# Patient Record
Sex: Female | Born: 1980 | Race: Black or African American | Hispanic: No | Marital: Single | Smoking: Never smoker
Health system: Southern US, Community
[De-identification: ages and names within clinical notes are randomized; demographics above are authoritative.]

## PROBLEM LIST (undated history)

## (undated) DIAGNOSIS — R0602 Shortness of breath: Secondary | ICD-10-CM

## (undated) DIAGNOSIS — IMO0001 Reserved for inherently not codable concepts without codable children: Secondary | ICD-10-CM

## (undated) DIAGNOSIS — I10 Essential (primary) hypertension: Secondary | ICD-10-CM

## (undated) DIAGNOSIS — J45909 Unspecified asthma, uncomplicated: Secondary | ICD-10-CM

## (undated) DIAGNOSIS — R600 Localized edema: Secondary | ICD-10-CM

## (undated) DIAGNOSIS — E7800 Pure hypercholesterolemia, unspecified: Secondary | ICD-10-CM

## (undated) HISTORY — DX: Pure hypercholesterolemia, unspecified: E78.00

## (undated) HISTORY — DX: Unspecified asthma, uncomplicated: J45.909

## (undated) HISTORY — DX: Localized edema: R60.0

## (undated) HISTORY — DX: Shortness of breath: R06.02

---

## 2008-03-09 ENCOUNTER — Emergency Department (HOSPITAL_BASED_OUTPATIENT_CLINIC_OR_DEPARTMENT_OTHER): Admission: EM | Admit: 2008-03-09 | Discharge: 2008-03-09 | Payer: Self-pay | Admitting: Emergency Medicine

## 2009-03-23 ENCOUNTER — Emergency Department (HOSPITAL_BASED_OUTPATIENT_CLINIC_OR_DEPARTMENT_OTHER): Admission: EM | Admit: 2009-03-23 | Discharge: 2009-03-23 | Payer: Self-pay | Admitting: Emergency Medicine

## 2010-04-04 LAB — STREP A DNA PROBE: Group A Strep Probe: NEGATIVE

## 2010-04-04 LAB — RAPID STREP SCREEN (MED CTR MEBANE ONLY): Streptococcus, Group A Screen (Direct): NEGATIVE

## 2010-07-18 ENCOUNTER — Emergency Department (HOSPITAL_BASED_OUTPATIENT_CLINIC_OR_DEPARTMENT_OTHER)
Admission: EM | Admit: 2010-07-18 | Discharge: 2010-07-18 | Disposition: A | Discharge status: Home / Self Care | Payer: Medicaid Other | Attending: Emergency Medicine | Admitting: Emergency Medicine

## 2010-07-18 ENCOUNTER — Emergency Department (INDEPENDENT_AMBULATORY_CARE_PROVIDER_SITE_OTHER): Payer: Medicaid Other

## 2010-07-18 ENCOUNTER — Encounter: Payer: Self-pay | Admitting: *Deleted

## 2010-07-18 DIAGNOSIS — W503XXA Accidental bite by another person, initial encounter: Secondary | ICD-10-CM

## 2010-07-18 DIAGNOSIS — S61459A Open bite of unspecified hand, initial encounter: Secondary | ICD-10-CM

## 2010-07-18 DIAGNOSIS — Y92009 Unspecified place in unspecified non-institutional (private) residence as the place of occurrence of the external cause: Secondary | ICD-10-CM | POA: Insufficient documentation

## 2010-07-18 DIAGNOSIS — S62609A Fracture of unspecified phalanx of unspecified finger, initial encounter for closed fracture: Secondary | ICD-10-CM | POA: Insufficient documentation

## 2010-07-18 DIAGNOSIS — IMO0002 Reserved for concepts with insufficient information to code with codable children: Secondary | ICD-10-CM

## 2010-07-18 DIAGNOSIS — S61409A Unspecified open wound of unspecified hand, initial encounter: Secondary | ICD-10-CM | POA: Insufficient documentation

## 2010-07-18 HISTORY — DX: Reserved for inherently not codable concepts without codable children: IMO0001

## 2010-07-18 MED ORDER — TETANUS-DIPHTH-ACELL PERTUSSIS 5-2.5-18.5 LF-MCG/0.5 IM SUSP
0.5000 mL | Freq: Once | INTRAMUSCULAR | Status: AC
  Administered 2010-07-18: 0.5 mL via INTRAMUSCULAR
  Filled 2010-07-18: qty 0.5

## 2010-07-18 MED ORDER — AMOXICILLIN-POT CLAVULANATE 875-125 MG PO TABS: 1.0000 | ORAL_TABLET | Freq: Two times a day (BID) | ORAL | Status: AC

## 2010-07-18 MED ORDER — SODIUM CHLORIDE 0.9 % IV SOLN
3.0000 g | Freq: Four times a day (QID) | INTRAVENOUS | Status: DC
  Administered 2010-07-18: 3 g via INTRAVENOUS

## 2010-07-18 MED ORDER — HYDROCODONE-ACETAMINOPHEN 5-325 MG PO TABS
2.0000 | ORAL_TABLET | Freq: Once | ORAL | Status: AC
  Administered 2010-07-18: 2 via ORAL
  Filled 2010-07-18: qty 2

## 2010-07-18 MED ORDER — SODIUM CHLORIDE 0.9 % IV SOLN
INTRAVENOUS | Status: AC
  Administered 2010-07-18: 3 g via INTRAVENOUS
  Filled 2010-07-18: qty 3

## 2010-07-18 MED ORDER — HYDROCODONE-ACETAMINOPHEN 5-325 MG PO TABS: 2.0000 | ORAL_TABLET | ORAL | Status: AC | PRN
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
## 2010-07-18 NOTE — ED Notes (Signed)
Soaking Pt. Fingers L middle and L ring in cleaning solution.
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
## 2012-01-31 ENCOUNTER — Encounter (HOSPITAL_BASED_OUTPATIENT_CLINIC_OR_DEPARTMENT_OTHER): Payer: Self-pay | Admitting: *Deleted

## 2012-01-31 ENCOUNTER — Emergency Department (HOSPITAL_BASED_OUTPATIENT_CLINIC_OR_DEPARTMENT_OTHER)
Admission: EM | Admit: 2012-01-31 | Discharge: 2012-02-01 | Disposition: A | Discharge status: Home / Self Care | Payer: No Typology Code available for payment source | Attending: Emergency Medicine | Admitting: Emergency Medicine

## 2012-01-31 DIAGNOSIS — Y9289 Other specified places as the place of occurrence of the external cause: Secondary | ICD-10-CM | POA: Insufficient documentation

## 2012-01-31 DIAGNOSIS — S43499A Other sprain of unspecified shoulder joint, initial encounter: Secondary | ICD-10-CM | POA: Insufficient documentation

## 2012-01-31 DIAGNOSIS — S8990XA Unspecified injury of unspecified lower leg, initial encounter: Secondary | ICD-10-CM | POA: Insufficient documentation

## 2012-01-31 DIAGNOSIS — M25569 Pain in unspecified knee: Secondary | ICD-10-CM

## 2012-01-31 DIAGNOSIS — Y9389 Activity, other specified: Secondary | ICD-10-CM | POA: Insufficient documentation

## 2012-01-31 DIAGNOSIS — Z79899 Other long term (current) drug therapy: Secondary | ICD-10-CM | POA: Insufficient documentation

## 2012-01-31 DIAGNOSIS — I10 Essential (primary) hypertension: Secondary | ICD-10-CM | POA: Insufficient documentation

## 2012-01-31 DIAGNOSIS — S99919A Unspecified injury of unspecified ankle, initial encounter: Secondary | ICD-10-CM | POA: Insufficient documentation

## 2012-01-31 DIAGNOSIS — IMO0002 Reserved for concepts with insufficient information to code with codable children: Secondary | ICD-10-CM | POA: Insufficient documentation

## 2012-01-31 DIAGNOSIS — S46819A Strain of other muscles, fascia and tendons at shoulder and upper arm level, unspecified arm, initial encounter: Secondary | ICD-10-CM

## 2012-01-31 HISTORY — DX: Essential (primary) hypertension: I10

## 2012-01-31 MED ORDER — OXYCODONE-ACETAMINOPHEN 5-325 MG PO TABS
2.0000 | ORAL_TABLET | Freq: Once | ORAL | Status: AC
  Administered 2012-02-01: 2 via ORAL
  Filled 2012-01-31 (×2): qty 2

## 2012-01-31 MED ORDER — IBUPROFEN 800 MG PO TABS
800.0000 mg | ORAL_TABLET | Freq: Once | ORAL | Status: AC
  Administered 2012-02-01: 800 mg via ORAL
  Filled 2012-01-31: qty 1

## 2012-01-31 MED ORDER — DIAZEPAM 5 MG PO TABS
5.0000 mg | ORAL_TABLET | Freq: Once | ORAL | Status: AC
  Administered 2012-02-01: 5 mg via ORAL
  Filled 2012-01-31: qty 1
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
## 2012-02-01 MED ORDER — DIAZEPAM 5 MG PO TABS: 5.0000 mg | ORAL_TABLET | Freq: Three times a day (TID) | ORAL | Status: DC | PRN

## 2012-02-01 MED ORDER — OXYCODONE-ACETAMINOPHEN 5-325 MG PO TABS: 1.0000 | ORAL_TABLET | Freq: Three times a day (TID) | ORAL | Status: DC | PRN

## 2012-02-01 MED ORDER — IBUPROFEN 800 MG PO TABS: 800.0000 mg | ORAL_TABLET | Freq: Three times a day (TID) | ORAL | Status: DC | PRN

## 2013-02-28 ENCOUNTER — Encounter (HOSPITAL_BASED_OUTPATIENT_CLINIC_OR_DEPARTMENT_OTHER): Payer: Self-pay | Admitting: Emergency Medicine

## 2013-02-28 ENCOUNTER — Emergency Department (HOSPITAL_BASED_OUTPATIENT_CLINIC_OR_DEPARTMENT_OTHER)
Admission: EM | Admit: 2013-02-28 | Discharge: 2013-02-28 | Disposition: A | Discharge status: Home / Self Care | Payer: Managed Care, Other (non HMO) | Attending: Emergency Medicine | Admitting: Emergency Medicine

## 2013-02-28 DIAGNOSIS — Z79899 Other long term (current) drug therapy: Secondary | ICD-10-CM | POA: Insufficient documentation

## 2013-02-28 DIAGNOSIS — I10 Essential (primary) hypertension: Secondary | ICD-10-CM | POA: Insufficient documentation

## 2013-02-28 DIAGNOSIS — J329 Chronic sinusitis, unspecified: Secondary | ICD-10-CM | POA: Insufficient documentation

## 2013-02-28 DIAGNOSIS — R197 Diarrhea, unspecified: Secondary | ICD-10-CM | POA: Insufficient documentation

## 2013-02-28 DIAGNOSIS — H6690 Otitis media, unspecified, unspecified ear: Secondary | ICD-10-CM | POA: Insufficient documentation

## 2013-02-28 DIAGNOSIS — H6691 Otitis media, unspecified, right ear: Secondary | ICD-10-CM

## 2013-02-28 MED ORDER — HYDROCODONE-ACETAMINOPHEN 7.5-325 MG/15ML PO SOLN: 10.0000 mL | Freq: Four times a day (QID) | ORAL | Status: DC | PRN

## 2013-02-28 MED ORDER — AMOXICILLIN 500 MG PO CAPS: 500.0000 mg | ORAL_CAPSULE | Freq: Three times a day (TID) | ORAL | Status: DC

## 2013-02-28 NOTE — Discharge Instructions (Signed)
Otitis Media, Adult °Otitis media is redness, soreness, and swelling (inflammation) of the middle ear. Otitis media may be caused by allergies or, most commonly, by infection. Often it occurs as a complication of the common cold. °SIGNS AND SYMPTOMS °Symptoms of otitis media may include: °· Earache. °· Fever. °· Ringing in your ear. °· Headache. °· Leakage of fluid from the ear. °DIAGNOSIS °To diagnose otitis media, your health care provider will examine your ear with an otoscope. This is an instrument that allows your health care provider to see into your ear in order to examine your eardrum. Your health care provider also will ask you questions about your symptoms. °TREATMENT  °Typically, otitis media resolves on its own within 3 5 days. Your health care provider may prescribe medicine to ease your symptoms of pain. If otitis media does not resolve within 5 days or is recurrent, your health care provider may prescribe antibiotic medicines if he or she suspects that a bacterial infection is the cause. °HOME CARE INSTRUCTIONS  °· Take your medicine as directed until it is gone, even if you feel better after the first few days. °· Only take over-the-counter or prescription medicines for pain, discomfort, or fever as directed by your health care provider. °· Follow up with your health care provider as directed. °SEEK MEDICAL CARE IF: °· You have otitis media only in one ear or bleeding from your nose or both. °· You notice a lump on your neck. °· You are not getting better in 3 5 days. °· You feel worse instead of better. °SEEK IMMEDIATE MEDICAL CARE IF:  °· You have pain that is not controlled with medicine. °· You have swelling, redness, or pain around your ear or stiffness in your neck. °· You notice that part of your face is paralyzed. °· You notice that the bone behind your ear (mastoid) is tender when you touch it. °MAKE SURE YOU:  °· Understand these instructions. °· Will watch your condition. °· Will get help  right away if you are not doing well or get worse. °Document Released: 10/01/2003 Document Revised: 10/16/2012 Document Reviewed: 07/23/2012 °ExitCare® Patient Information ©2014 [employer]. ° °Sinusitis °Sinusitis is redness, soreness, and swelling (inflammation) of the paranasal sinuses. Paranasal sinuses are air pockets within the bones of your face (beneath the eyes, the middle of the forehead, or above the eyes). In healthy paranasal sinuses, mucus is able to drain out, and air is able to circulate through them by way of your nose. However, when your paranasal sinuses are inflamed, mucus and air can become trapped. This can allow bacteria and other germs to grow and cause infection. °Sinusitis can develop quickly and last only a short time (acute) or continue over a long period (chronic). Sinusitis that lasts for more than 12 weeks is considered chronic.  °CAUSES  °Causes of sinusitis include: °· Allergies. °· Structural abnormalities, such as displacement of the cartilage that separates your nostrils (deviated septum), which can decrease the air flow through your nose and sinuses and affect sinus drainage. °· Functional abnormalities, such as when the small hairs (cilia) that line your sinuses and help remove mucus do not work properly or are not present. °SYMPTOMS  °Symptoms of acute and chronic sinusitis are the same. The primary symptoms are pain and pressure around the affected sinuses. Other symptoms include: °· Upper toothache. °· Earache. °· Headache. °· Bad breath. °· Decreased sense of smell and taste. °· A cough, which worsens when you are lying flat. °·   Fatigue. °· Fever. °· Thick drainage from your nose, which often is green and may contain pus (purulent). °· Swelling and warmth over the affected sinuses. °DIAGNOSIS  °Your caregiver will perform a physical exam. During the exam, your caregiver may: °· Look in your nose for signs of abnormal growths in your nostrils (nasal polyps). °· Tap over the  affected sinus to check for signs of infection. °· View the inside of your sinuses (endoscopy) with a special imaging device with a light attached (endoscope), which is inserted into your sinuses. °If your caregiver suspects that you have chronic sinusitis, one or more of the following tests may be recommended: °· Allergy tests. °· Nasal culture A sample of mucus is taken from your nose and sent to a lab and screened for bacteria. °· Nasal cytology A sample of mucus is taken from your nose and examined by your caregiver to determine if your sinusitis is related to an allergy. °TREATMENT  °Most cases of acute sinusitis are related to a viral infection and will resolve on their own within 10 days. Sometimes medicines are prescribed to help relieve symptoms (pain medicine, decongestants, nasal steroid sprays, or saline sprays).  °However, for sinusitis related to a bacterial infection, your caregiver will prescribe antibiotic medicines. These are medicines that will help kill the bacteria causing the infection.  °Rarely, sinusitis is caused by a fungal infection. In theses cases, your caregiver will prescribe antifungal medicine. °For some cases of chronic sinusitis, surgery is needed. Generally, these are cases in which sinusitis recurs more than 3 times per year, despite other treatments. °HOME CARE INSTRUCTIONS  °· Drink plenty of water. Water helps thin the mucus so your sinuses can drain more easily. °· Use a humidifier. °· Inhale steam 3 to 4 times a day (for example, sit in the bathroom with the shower running). °· Apply a warm, moist washcloth to your face 3 to 4 times a day, or as directed by your caregiver. °· Use saline nasal sprays to help moisten and clean your sinuses. °· Take over-the-counter or prescription medicines for pain, discomfort, or fever only as directed by your caregiver. °SEEK IMMEDIATE MEDICAL CARE IF: °· You have increasing pain or severe headaches. °· You have nausea, vomiting, or  drowsiness. °· You have swelling around your face. °· You have vision problems. °· You have a stiff neck. °· You have difficulty breathing. °MAKE SURE YOU:  °· Understand these instructions. °· Will watch your condition. °· Will get help right away if you are not doing well or get worse. °Document Released: 12/26/2004 Document Revised: 03/20/2011 Document Reviewed: 01/10/2011 °ExitCare® Patient Information ©2014 [employer]. ° °
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
## 2013-02-28 NOTE — ED Notes (Signed)
Reports "feeling bad" since Tuesday- n/v earlier this week- c/o diarrhea today x 3 and right ear pain

## 2013-02-28 NOTE — ED Provider Notes (Signed)
Medical screening examination/treatment/procedure(s) were performed by non-physician practitioner and as supervising physician I was immediately available for consultation/collaboration.  EKG Interpretation   None         Jenette Rayson, MD 02/28/13 2327 

## 2013-06-21 ENCOUNTER — Emergency Department (HOSPITAL_BASED_OUTPATIENT_CLINIC_OR_DEPARTMENT_OTHER)
Admission: EM | Admit: 2013-06-21 | Discharge: 2013-06-21 | Disposition: A | Discharge status: Home / Self Care | Payer: Managed Care, Other (non HMO) | Attending: Emergency Medicine | Admitting: Emergency Medicine

## 2013-06-21 ENCOUNTER — Emergency Department (HOSPITAL_BASED_OUTPATIENT_CLINIC_OR_DEPARTMENT_OTHER): Payer: Managed Care, Other (non HMO)

## 2013-06-21 ENCOUNTER — Encounter (HOSPITAL_BASED_OUTPATIENT_CLINIC_OR_DEPARTMENT_OTHER): Payer: Self-pay | Admitting: Emergency Medicine

## 2013-06-21 DIAGNOSIS — Y92410 Unspecified street and highway as the place of occurrence of the external cause: Secondary | ICD-10-CM | POA: Insufficient documentation

## 2013-06-21 DIAGNOSIS — S335XXA Sprain of ligaments of lumbar spine, initial encounter: Secondary | ICD-10-CM | POA: Insufficient documentation

## 2013-06-21 DIAGNOSIS — I10 Essential (primary) hypertension: Secondary | ICD-10-CM | POA: Insufficient documentation

## 2013-06-21 DIAGNOSIS — T148XXA Other injury of unspecified body region, initial encounter: Secondary | ICD-10-CM

## 2013-06-21 DIAGNOSIS — Z79899 Other long term (current) drug therapy: Secondary | ICD-10-CM | POA: Insufficient documentation

## 2013-06-21 DIAGNOSIS — Z3202 Encounter for pregnancy test, result negative: Secondary | ICD-10-CM | POA: Insufficient documentation

## 2013-06-21 DIAGNOSIS — Y9389 Activity, other specified: Secondary | ICD-10-CM | POA: Insufficient documentation

## 2013-06-21 LAB — PREGNANCY, URINE: Preg Test, Ur: NEGATIVE

## 2013-06-21 MED ORDER — METHOCARBAMOL 500 MG PO TABS
1000.0000 mg | ORAL_TABLET | Freq: Once | ORAL | Status: AC
Start: 2013-06-21 — End: 2013-06-21
  Administered 2013-06-21: 1000 mg via ORAL
  Filled 2013-06-21: qty 2

## 2013-06-21 MED ORDER — OXYCODONE-ACETAMINOPHEN 5-325 MG PO TABS: 1.0000 | ORAL_TABLET | ORAL | Status: DC | PRN

## 2013-06-21 MED ORDER — MELOXICAM 7.5 MG PO TABS: 7.5000 mg | ORAL_TABLET | Freq: Every day | ORAL | Status: DC

## 2013-06-21 MED ORDER — METHOCARBAMOL 500 MG PO TABS: 500.0000 mg | ORAL_TABLET | Freq: Two times a day (BID) | ORAL | Status: DC
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
## 2013-06-21 NOTE — Discharge Instructions (Signed)
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
## 2015-02-09 ENCOUNTER — Emergency Department (HOSPITAL_BASED_OUTPATIENT_CLINIC_OR_DEPARTMENT_OTHER)
Admission: EM | Admit: 2015-02-09 | Discharge: 2015-02-09 | Disposition: A | Discharge status: Home / Self Care | Payer: Managed Care, Other (non HMO) | Attending: Emergency Medicine | Admitting: Emergency Medicine

## 2015-02-09 ENCOUNTER — Encounter (HOSPITAL_BASED_OUTPATIENT_CLINIC_OR_DEPARTMENT_OTHER): Payer: Self-pay | Admitting: *Deleted

## 2015-02-09 ENCOUNTER — Emergency Department (HOSPITAL_BASED_OUTPATIENT_CLINIC_OR_DEPARTMENT_OTHER): Payer: Managed Care, Other (non HMO)

## 2015-02-09 DIAGNOSIS — Y9289 Other specified places as the place of occurrence of the external cause: Secondary | ICD-10-CM | POA: Diagnosis not present

## 2015-02-09 DIAGNOSIS — Z79899 Other long term (current) drug therapy: Secondary | ICD-10-CM | POA: Diagnosis not present

## 2015-02-09 DIAGNOSIS — I10 Essential (primary) hypertension: Secondary | ICD-10-CM | POA: Insufficient documentation

## 2015-02-09 DIAGNOSIS — Y998 Other external cause status: Secondary | ICD-10-CM | POA: Diagnosis not present

## 2015-02-09 DIAGNOSIS — M25562 Pain in left knee: Secondary | ICD-10-CM

## 2015-02-09 DIAGNOSIS — S8991XA Unspecified injury of right lower leg, initial encounter: Secondary | ICD-10-CM | POA: Insufficient documentation

## 2015-02-09 DIAGNOSIS — Y9389 Activity, other specified: Secondary | ICD-10-CM | POA: Insufficient documentation

## 2015-02-09 DIAGNOSIS — W108XXA Fall (on) (from) other stairs and steps, initial encounter: Secondary | ICD-10-CM | POA: Insufficient documentation

## 2015-02-09 DIAGNOSIS — W19XXXA Unspecified fall, initial encounter: Secondary | ICD-10-CM

## 2015-02-09 DIAGNOSIS — Z791 Long term (current) use of non-steroidal anti-inflammatories (NSAID): Secondary | ICD-10-CM | POA: Insufficient documentation

## 2015-02-09 MED ORDER — HYDROCHLOROTHIAZIDE 25 MG PO TABS
25.0000 mg | ORAL_TABLET | Freq: Once | ORAL | Status: AC
  Administered 2015-02-09: 25 mg via ORAL
  Filled 2015-02-09: qty 1

## 2015-02-09 MED ORDER — IBUPROFEN 800 MG PO TABS
800.0000 mg | ORAL_TABLET | Freq: Once | ORAL | Status: AC
  Administered 2015-02-09: 800 mg via ORAL
  Filled 2015-02-09: qty 1

## 2015-02-09 MED ORDER — IBUPROFEN 800 MG PO TABS: 800.0000 mg | ORAL_TABLET | Freq: Three times a day (TID) | ORAL | Status: DC

## 2015-02-09 NOTE — ED Notes (Addendum)
She slipped and fell down 3 steps 5 days ago. Injury to her left knee and lower leg. She also has a vaginal discharge. BP noted. Pt states she has been taking her BP medication but has been under a lot of stress.

## 2015-02-09 NOTE — ED Notes (Signed)
Pt states she fell Friday while carrying boxes. C/O pain to left knee area. Also c/o beige vaginal d/c x 3 weeks. Denies other s/s.

## 2015-02-09 NOTE — ED Notes (Signed)
Pt feels relief and comfort with application of knee sleeve.

## 2015-02-09 NOTE — ED Notes (Signed)
PA at bedside.

## 2015-02-09 NOTE — Discharge Instructions (Signed)
You have been seen today for a fall and knee pain. Your imaging showed no abnormalities. Follow up with PCP as soon as possible for medication adjustment. Return to ED should symptoms worsen or you have any symptoms listed on the attached sheets that would indicate a more concerning condition.

## 2015-02-09 NOTE — ED Notes (Signed)
Pt refuses crutches. States she will not use them and feels okay to walk without them.
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
## 2015-09-06 ENCOUNTER — Encounter (HOSPITAL_BASED_OUTPATIENT_CLINIC_OR_DEPARTMENT_OTHER): Payer: Self-pay | Admitting: *Deleted

## 2015-09-06 ENCOUNTER — Emergency Department (HOSPITAL_BASED_OUTPATIENT_CLINIC_OR_DEPARTMENT_OTHER)
Admission: EM | Admit: 2015-09-06 | Discharge: 2015-09-06 | Disposition: A | Discharge status: Home / Self Care | Payer: Managed Care, Other (non HMO) | Attending: Emergency Medicine | Admitting: Emergency Medicine

## 2015-09-06 DIAGNOSIS — Y999 Unspecified external cause status: Secondary | ICD-10-CM | POA: Insufficient documentation

## 2015-09-06 DIAGNOSIS — Y9389 Activity, other specified: Secondary | ICD-10-CM | POA: Insufficient documentation

## 2015-09-06 DIAGNOSIS — Y92410 Unspecified street and highway as the place of occurrence of the external cause: Secondary | ICD-10-CM | POA: Diagnosis not present

## 2015-09-06 DIAGNOSIS — M545 Low back pain: Secondary | ICD-10-CM | POA: Insufficient documentation

## 2015-09-06 DIAGNOSIS — I10 Essential (primary) hypertension: Secondary | ICD-10-CM | POA: Insufficient documentation

## 2015-09-06 DIAGNOSIS — Z5321 Procedure and treatment not carried out due to patient leaving prior to being seen by health care provider: Secondary | ICD-10-CM | POA: Insufficient documentation

## 2015-09-06 NOTE — ED Notes (Signed)
Seen leaving by registration
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
## 2016-02-10 ENCOUNTER — Emergency Department (INDEPENDENT_AMBULATORY_CARE_PROVIDER_SITE_OTHER)
Admission: EM | Admit: 2016-02-10 | Discharge: 2016-02-10 | Disposition: A | Discharge status: Home / Self Care | Payer: Managed Care, Other (non HMO) | Source: Home / Self Care | Attending: Family Medicine | Admitting: Family Medicine

## 2016-02-10 ENCOUNTER — Encounter: Payer: Self-pay | Admitting: *Deleted

## 2016-02-10 DIAGNOSIS — R6889 Other general symptoms and signs: Secondary | ICD-10-CM | POA: Diagnosis not present

## 2016-02-10 MED ORDER — IBUPROFEN 600 MG PO TABS
600.0000 mg | ORAL_TABLET | Freq: Once | ORAL | Status: AC
  Administered 2016-02-10: 600 mg via ORAL

## 2016-02-10 MED ORDER — OSELTAMIVIR PHOSPHATE 75 MG PO CAPS: 75.0000 mg | ORAL_CAPSULE | Freq: Two times a day (BID) | ORAL | 0 refills | Status: DC

## 2016-02-10 MED ORDER — BENZONATATE 100 MG PO CAPS: 100.0000 mg | ORAL_CAPSULE | Freq: Three times a day (TID) | ORAL | 0 refills | Status: DC

## 2016-02-10 NOTE — Discharge Instructions (Signed)

## 2016-02-10 NOTE — ED Triage Notes (Signed)
Patient c/o body aches, chills, fever and cough since yesterday. No flu vac this season.

## 2016-02-10 NOTE — ED Provider Notes (Signed)
CSN: [id number]     Arrival date & time 02/10/16  [id number] History   First MD Initiated Contact with Patient 02/10/16 1046     Chief Complaint  Patient presents with  . Generalized Body Aches  . Fever   (Consider location/radiation/quality/duration/timing/severity/associated sxs/prior Treatment) HPI Beth Evans is a 36 y.o. female presenting to UC with c/o sudden onset body aches, chills, fever of 101*F, and dry cough since yesterday.  She did not get the flu vaccine this year.  She has taken acetaminophen last night but nothing today.  Denies n/v/d. Denies chest pain or SOB. Others at work have been sick.  Elevated BP in UC: she has not taken her HTN medication this morning.   Past Medical History:  Diagnosis Date  . Hypertension   . IUD    History reviewed. No pertinent surgical history. History reviewed. No pertinent family history. Social History  Substance Use Topics  . Smoking status: Never Smoker  . Smokeless tobacco: Never Used  . Alcohol use No   OB History    No data available     Review of Systems  Constitutional: Positive for chills, fatigue and fever.  HENT: Positive for congestion, postnasal drip and rhinorrhea. Negative for ear pain, sore throat, trouble swallowing and voice change.   Respiratory: Positive for cough. Negative for shortness of breath.   Cardiovascular: Negative for chest pain and palpitations.  Gastrointestinal: Negative for abdominal pain, diarrhea, nausea and vomiting.  Musculoskeletal: Positive for arthralgias, back pain and myalgias.       Body aches  Skin: Negative for rash.  Neurological: Positive for headaches. Negative for dizziness and light-headedness.    Allergies  Patient has no known allergies.  Home Medications   Prior to Admission medications   Medication Sig Start Date End Date Taking? Authorizing Provider  triamterene-hydrochlorothiazide (DYAZIDE) 37.5-25 MG capsule Take 1 capsule by mouth daily.   Yes Historical  Provider, MD  benzonatate (TESSALON) 100 MG capsule Take 1-2 capsules (100-200 mg total) by mouth every 8 (eight) hours. 02/10/16   Junius FinnerErin O'Malley, PA-C  oseltamivir (TAMIFLU) 75 MG capsule Take 1 capsule (75 mg total) by mouth every 12 (twelve) hours. 02/10/16   Junius FinnerErin O'Malley, PA-C   Meds Ordered and Administered this Visit   Medications  ibuprofen (ADVIL,MOTRIN) tablet 600 mg (600 mg Oral Given 02/10/16 1038)    BP (!) 170/117 (BP Location: Left Arm)   Pulse (!) 123   Temp 100.8 F (38.2 C) (Oral)   Resp 18   Wt (!) 304 lb (137.9 kg)   SpO2 97%   BMI 46.22 kg/m  No data found.   Physical Exam  Constitutional: She is oriented to person, place, and time. She appears well-developed and well-nourished. No distress.  HENT:  Head: Normocephalic and atraumatic.  Right Ear: Tympanic membrane normal.  Left Ear: Tympanic membrane normal.  Nose: Mucosal edema present. Right sinus exhibits no maxillary sinus tenderness and no frontal sinus tenderness. Left sinus exhibits no maxillary sinus tenderness and no frontal sinus tenderness.  Mouth/Throat: Uvula is midline, oropharynx is clear and moist and mucous membranes are normal.  Eyes: EOM are normal.  Neck: Normal range of motion. Neck supple.  Cardiovascular: Regular rhythm.  Tachycardia present.   Pulmonary/Chest: Effort normal. No stridor. No respiratory distress. She has no wheezes. She has no rales.  Musculoskeletal: Normal range of motion.  Lymphadenopathy:    She has no cervical adenopathy.  Neurological: She is alert and oriented to person, place, and time.  Skin: Skin is warm. She is diaphoretic.  Psychiatric: She has a normal mood and affect. Her behavior is normal.  Nursing note and vitals reviewed.   Urgent Care Course     Procedures (including critical care time)  Labs Review Labs Reviewed - No data to display  Imaging Review No results found.   MDM   1. Flu-like symptoms    Pt presenting to UC with flu-like  symptoms since yesterday. Sick contacts. She did not get the flu vaccine this year.  Discussed risks/benefits of Tamiflu. Pt would like to try the treatment. Rx: Tamiflu and tessalon  F/u with PCP in 1 week if not improving.      Junius Finner, PA-C 02/10/16 1353

## 2018-01-07 IMAGING — DX DG KNEE COMPLETE 4+V*L*
4 series · 4 of 4 positions shown · non-contrast
Comparison: None

CLINICAL DATA: Fell 4 days ago, LEFT knee pain laterally and above
patella, initial encounter

EXAM:
LEFT KNEE - COMPLETE 4+ VIEW

[knee ap]
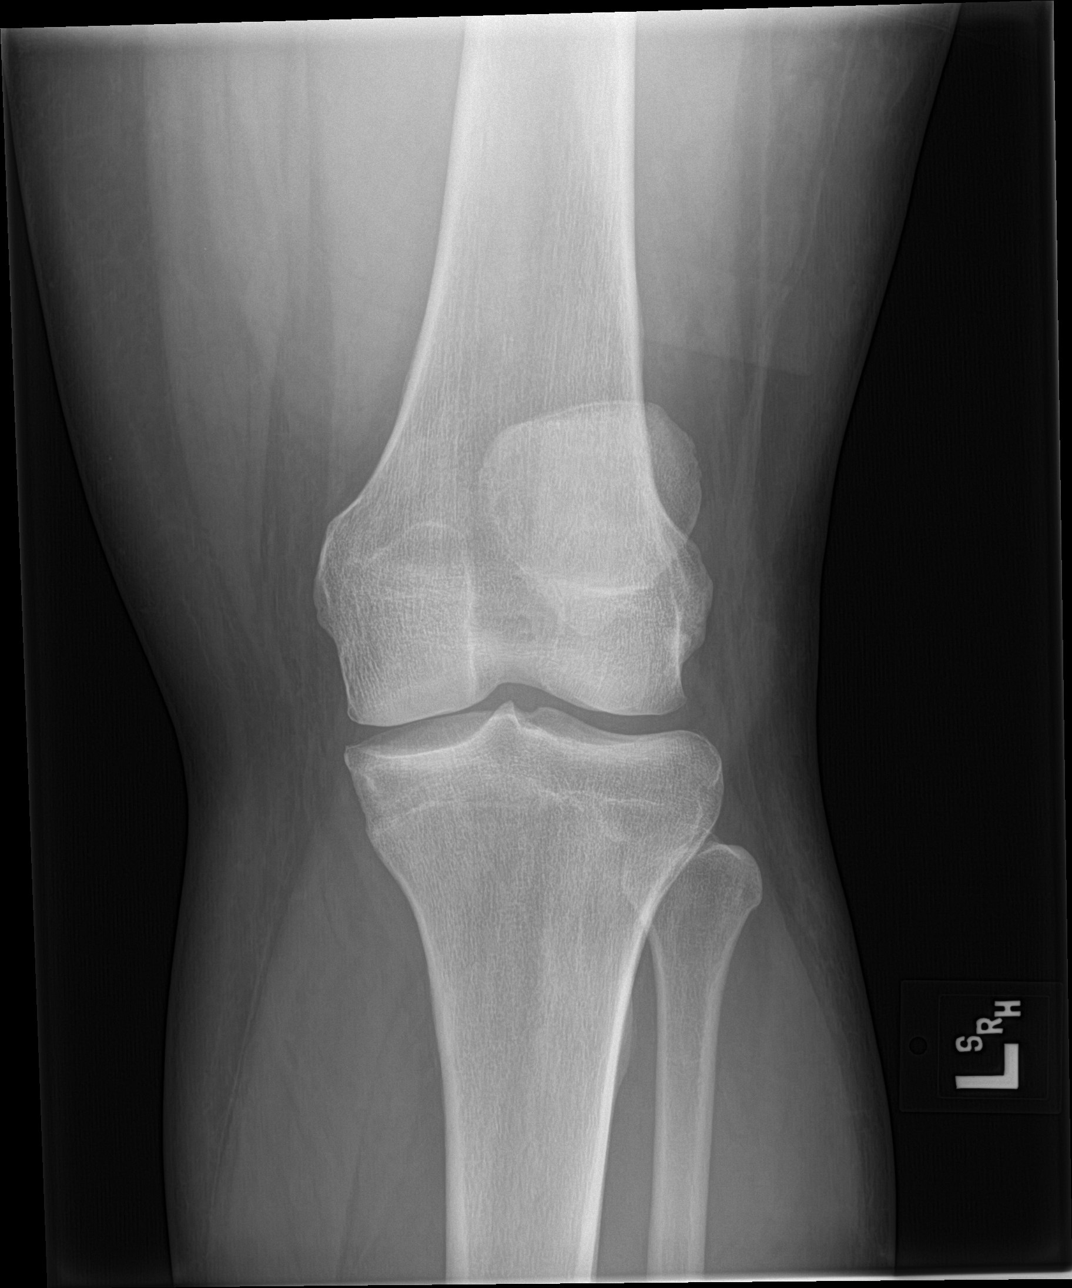

[knee lat]
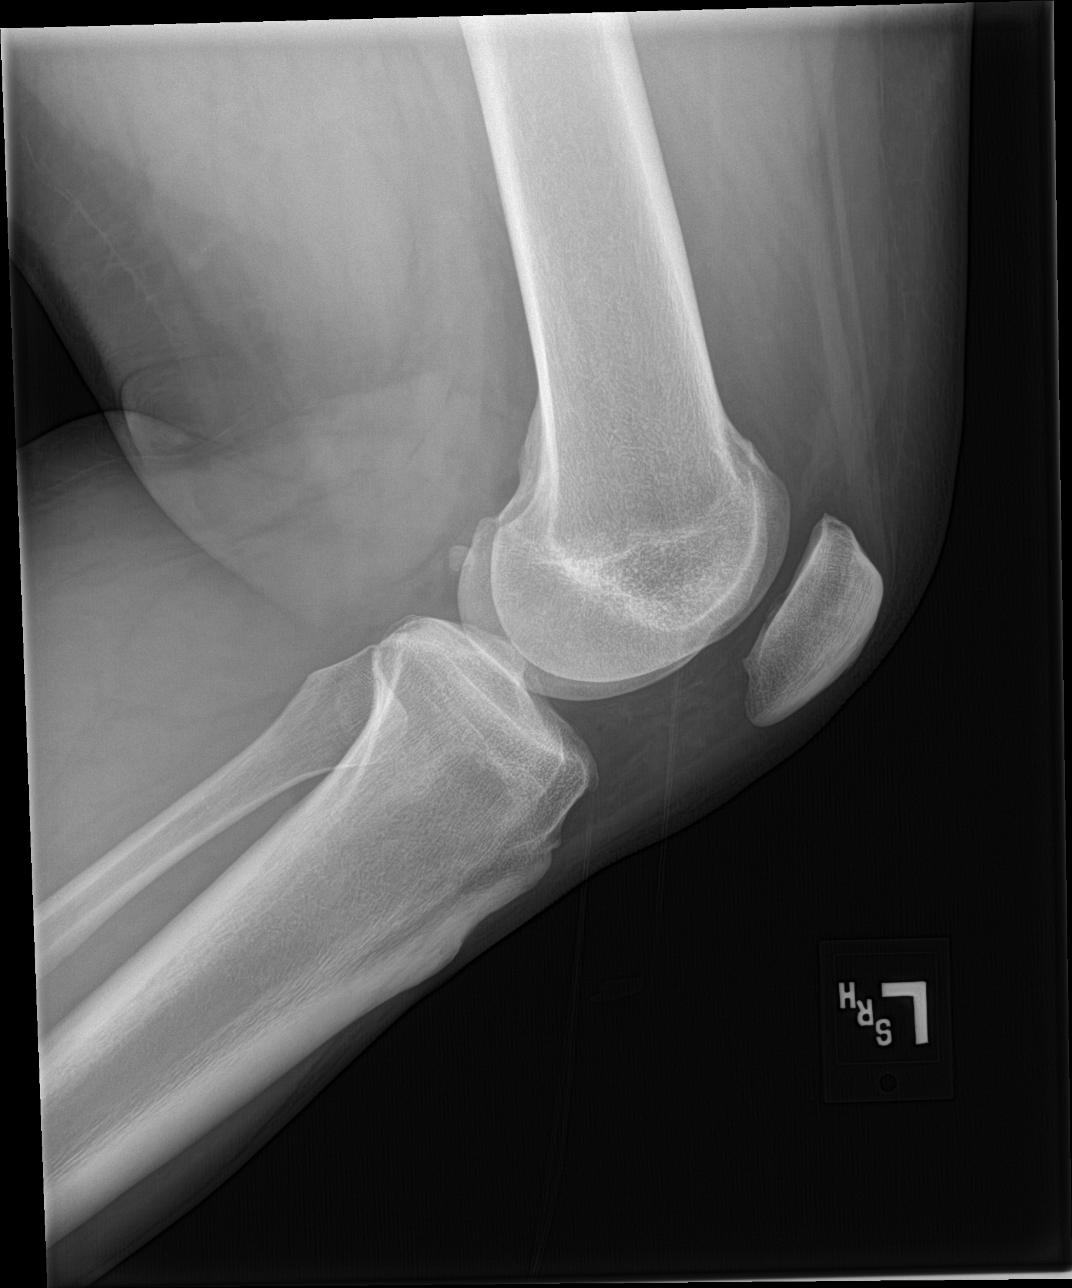

[knee obl (1 of 2)]
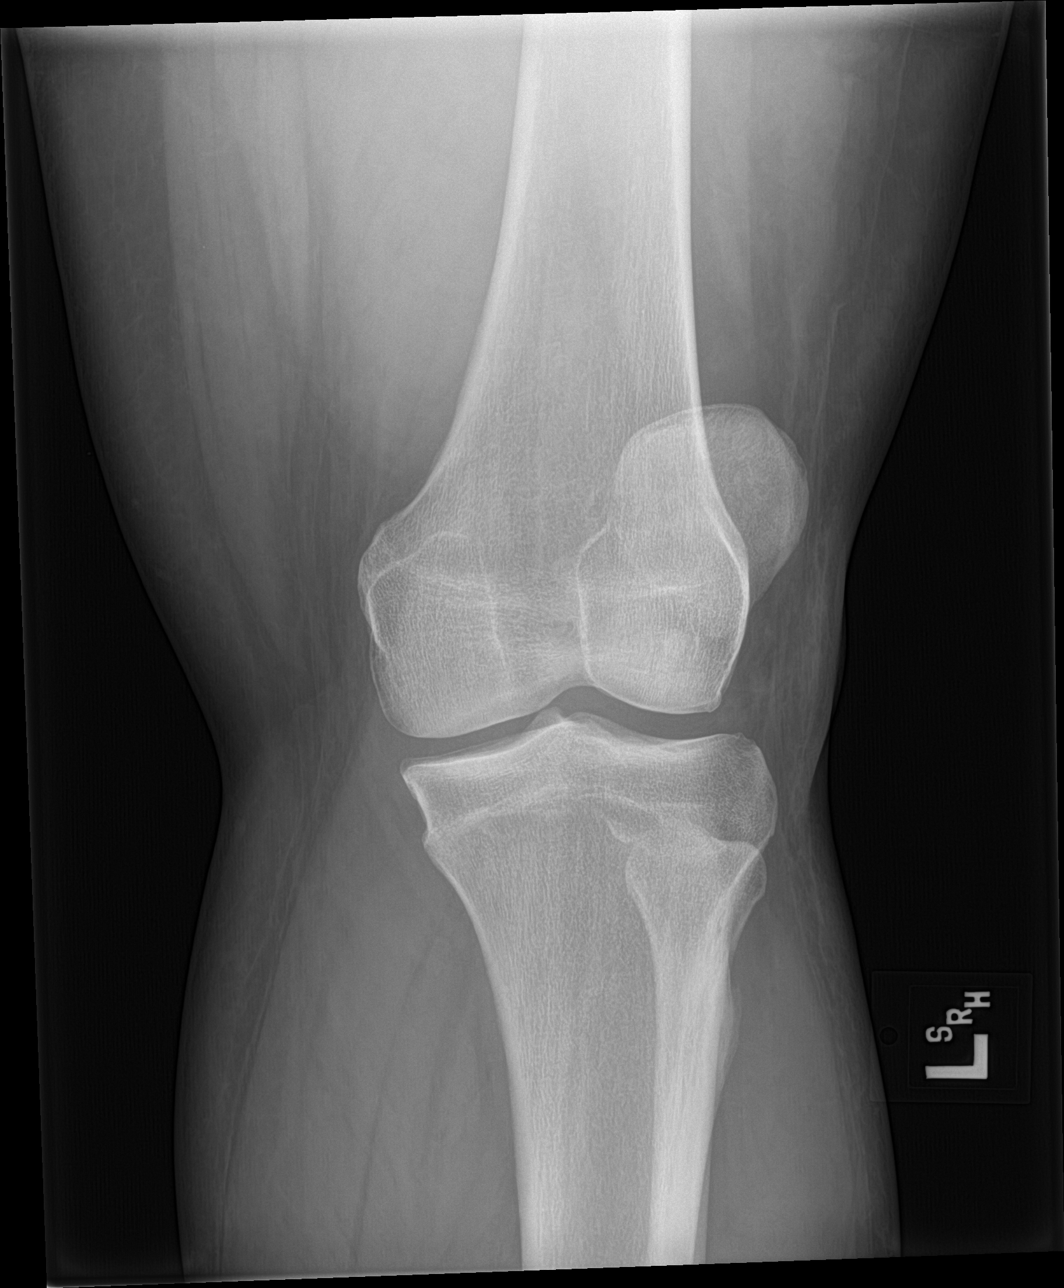

[knee obl (2 of 2)]
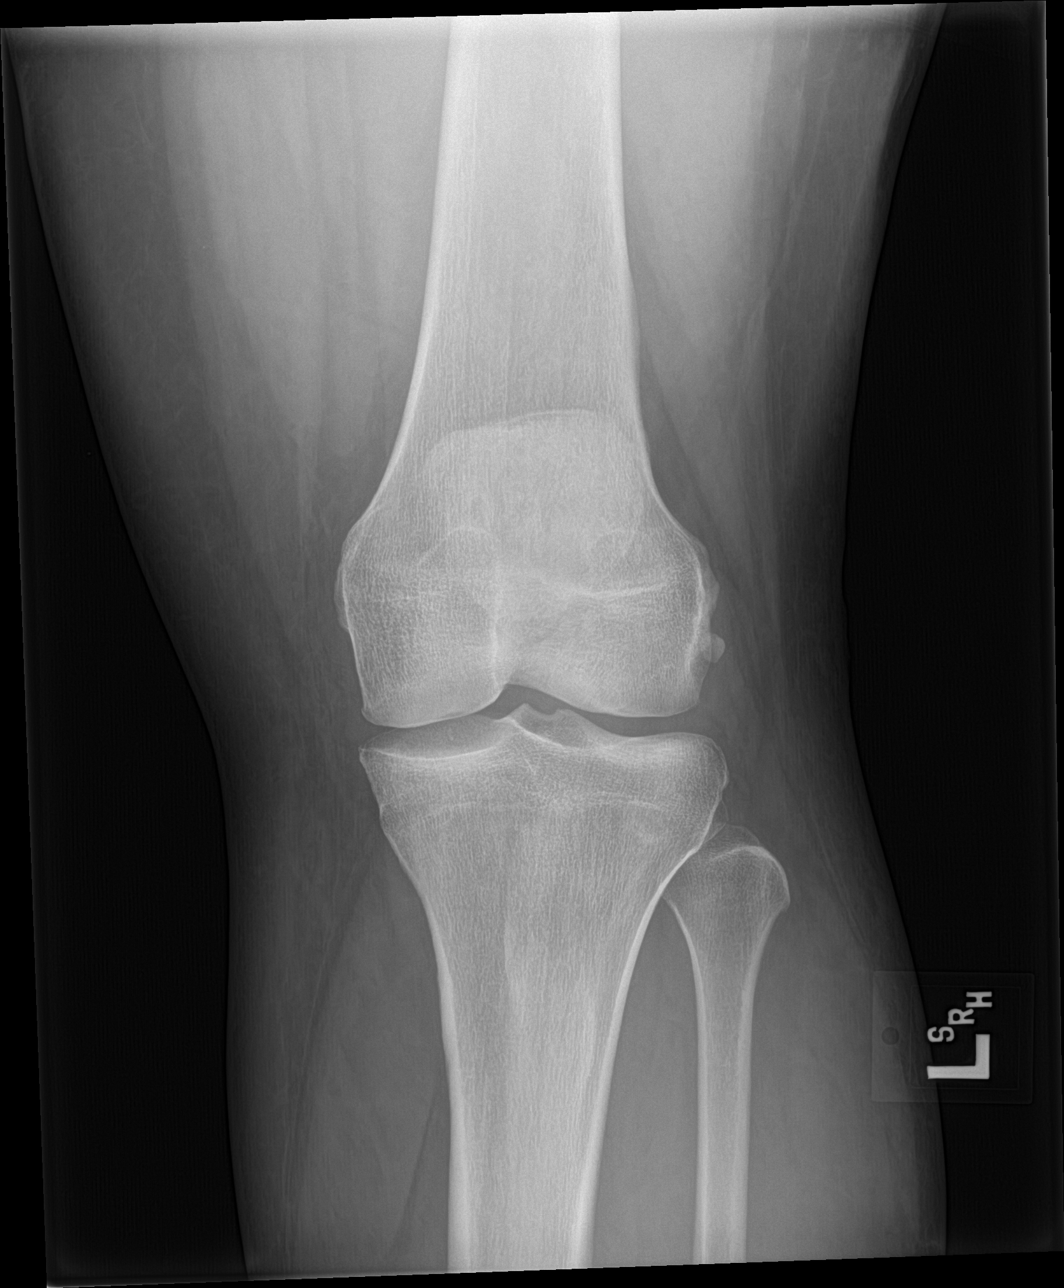

[4 of 4 positions shown; findings below may reference images not displayed]

FINDINGS: Bone mineralization normal.

Joint spaces preserved.

No fracture, dislocation, or bone destruction.

No joint effusion.

Question mild lateral soft tissue swelling.
IMPRESSION: No acute osseous abnormalities.

## 2018-08-12 ENCOUNTER — Telehealth: Payer: Self-pay | Admitting: *Deleted

## 2018-08-12 ENCOUNTER — Encounter: Payer: Self-pay | Admitting: *Deleted

## 2018-08-12 ENCOUNTER — Other Ambulatory Visit: Payer: Self-pay

## 2018-08-12 ENCOUNTER — Emergency Department
Admission: EM | Admit: 2018-08-12 | Discharge: 2018-08-12 | Disposition: A | Discharge status: Home / Self Care | Payer: Managed Care, Other (non HMO) | Source: Home / Self Care | Attending: Family Medicine | Admitting: Family Medicine

## 2018-08-12 DIAGNOSIS — R197 Diarrhea, unspecified: Secondary | ICD-10-CM | POA: Diagnosis not present

## 2018-08-12 DIAGNOSIS — R030 Elevated blood-pressure reading, without diagnosis of hypertension: Secondary | ICD-10-CM

## 2018-08-12 DIAGNOSIS — Z20822 Contact with and (suspected) exposure to covid-19: Secondary | ICD-10-CM

## 2018-08-12 MED ORDER — ONDANSETRON 4 MG PO TBDP: 4.0000 mg | ORAL_TABLET | Freq: Three times a day (TID) | ORAL | 0 refills | Status: DC | PRN

## 2018-08-12 MED ORDER — ONDANSETRON 4 MG PO TBDP
4.0000 mg | ORAL_TABLET | Freq: Once | ORAL | Status: AC
  Administered 2018-08-12: 4 mg via ORAL

## 2018-08-12 MED ORDER — ACETAMINOPHEN 325 MG PO TABS
650.0000 mg | ORAL_TABLET | Freq: Once | ORAL | Status: AC
  Administered 2018-08-12: 650 mg via ORAL

## 2018-08-12 NOTE — ED Triage Notes (Signed)
Pt c/o diarrhea, HA, chills, and feeling hot x 2 days. A little SOB today. Denies cough. Went to a funeral 3 days ago.

## 2018-08-12 NOTE — ED Provider Notes (Signed)
[facility]    CSN: [id number] Arrival date & time: 08/12/18  1306     History   Chief Complaint Chief Complaint  Patient presents with  . Diarrhea  . Fever    HPI Beth Evans is a 38 y.o. female.   HPI  Beth Bowelsmtosha Ibarra is a 38 y.o. female presenting to UC with c/o 2 days worsening generalized HA, chills, subjective fever, and diarrhea. She has had 3 episodes of diarrhea in the last 24 hours. Pt states she feels like she cannot keep down any fluids. She has not taken her BP medication in 2 days due to not feeling well and the diarrhea.  She notes her [place] returned from a trip to [place] last Wednesday, 08/07/2018.  Pt attended a funeral on Friday, 08/09/2018, symptoms started the next day, on Saturday.  She has tried Tylenol w/o relief of symptoms. She has not taken any medication today.  She reports intermittent SOB, hx of mild asthma but has not been using an inhaler. Denies chest pain at this time.  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 She wears a mask at work and all passengers are required to wear masks as well. No known sick contacts. No recent travel.   Past Medical History:  Diagnosis Date  . Hypertension   . IUD     There are no active problems to display for this patient.   History reviewed. No pertinent surgical history.  OB History   No obstetric history on file.      Home Medications    Prior to Admission medications   Medication Sig Start Date End Date Taking? Authorizing Provider  ondansetron (ZOFRAN ODT) 4 MG disintegrating tablet Take 1 tablet (4 mg total) by mouth every 8 (eight) hours as needed. 08/12/18   [name], Lindie Roberson O, PA-C    Family History History reviewed. No pertinent family history.  Social History Social History   Tobacco Use  . Smoking status: Never Smoker  . Smokeless tobacco: Never Used  Substance Use Topics  . Alcohol use: No  . Drug use: No     Allergies   Patient has no known allergies.    Review of Systems Review of Systems  Constitutional: Positive for chills, fatigue and fever (subjective).  HENT: Negative for congestion, ear pain, sore throat, trouble swallowing and voice change.   Respiratory: Negative for cough and shortness of breath.   Cardiovascular: Negative for chest pain and palpitations.  Gastrointestinal: Positive for diarrhea and nausea. Negative for abdominal pain and vomiting.  Musculoskeletal: Negative for arthralgias, back pain and myalgias.  Skin: Negative for rash.  Neurological: Positive for headaches. Negative for dizziness and light-headedness.     Physical Exam Triage Vital Signs ED Triage Vitals  Enc Vitals Group     BP 08/12/18 1400 (!) 184/140     Pulse Rate 08/12/18 1400 (!) 116     Resp 08/12/18 1400 18     Temp 08/12/18 1400 100.1 F (37.8 C)     Temp Source 08/12/18 1400 Oral     SpO2 08/12/18 1400 97 %     Weight --      Height --      Head Circumference --      Peak Flow --      Pain Score 08/12/18 1401 0     Pain Loc --      Pain Edu? --      Excl. in GC? --  No data found.  Updated Vital Signs BP (!) 177/146 (BP Location: Left Arm)   Pulse (!) 107   Temp 100.1 F (37.8 C) (Oral)   Resp 18   LMP 08/08/2018   SpO2 97%   Visual Acuity Right Eye Distance:   Left Eye Distance:   Bilateral Distance:    Right Eye Near:   Left Eye Near:    Bilateral Near:     Physical Exam Vitals signs and nursing note reviewed.  Constitutional:      General: She is not in acute distress.    Appearance: Normal appearance. She is well-developed. She is obese. She is not diaphoretic.  HENT:     Head: Normocephalic and atraumatic.     Right Ear: Tympanic membrane normal.     Left Ear: Tympanic membrane normal.     Nose: Nose normal.     Right Sinus: No maxillary sinus tenderness or frontal sinus tenderness.     Left Sinus: No maxillary sinus tenderness or frontal sinus tenderness.     Mouth/Throat:     Lips: Pink.      Mouth: Mucous membranes are moist.     Pharynx: Oropharynx is clear. Uvula midline.  Eyes:     Extraocular Movements: Extraocular movements intact.  Neck:     Musculoskeletal: Normal range of motion.  Cardiovascular:     Rate and Rhythm: Regular rhythm. Tachycardia present.  Pulmonary:     Effort: Pulmonary effort is normal. No respiratory distress.     Breath sounds: Normal breath sounds. No stridor. No wheezing, rhonchi or rales.  Abdominal:     Palpations: Abdomen is soft.     Tenderness: There is no abdominal tenderness. There is no right CVA tenderness or left CVA tenderness.  Musculoskeletal: Normal range of motion.  Skin:    General: Skin is warm and dry.     Capillary Refill: Capillary refill takes less than 2 seconds.     Findings: No rash.  Neurological:     Mental Status: She is alert and oriented to person, place, and time.  Psychiatric:        Behavior: Behavior normal.      UC Treatments / Results  Labs (all labs ordered are listed, but only abnormal results are displayed) Labs Reviewed  NOVEL CORONAVIRUS, NAA    EKG   Radiology No results found.  Procedures Procedures (including critical care time)  Medications Ordered in UC Medications  acetaminophen (TYLENOL) tablet 650 mg (650 mg Oral Given 08/12/18 1410)  ondansetron (ZOFRAN-ODT) disintegrating tablet 4 mg (4 mg Oral Given 08/12/18 1432)    Initial Impression / Assessment and Plan / UC Course  I have reviewed the triage vital signs and the nursing notes.  Pertinent labs & imaging results that were available during my care of the patient were reviewed by me and considered in my medical decision making (see chart for details).    BP elevated and pt tachycardic. Pt has not taken BP medication in 2 days. Denies chest pain or dizziness at this time.   Suspect Covid-19 given URI and GI symptoms as well as pt's occupation as city bus driver, contact with daughter who recently traveled to [place] and  attended a funeral. Covid test pending.  Pt given acetaminophen and zofran.   3:09 PM at time of discharge, pt's BP was rechecked, it was 205/139.  Pt wanted to lie down to see if her BP would improve, after 15 minutes it was rechecked, only improved  to 177/146.  Instructed pt to go to emergency department for further evaluation and treatment. Pt agreeable. Denies HA, change in vision, chest pain or SOB.  Pt declined EMS transport.    Final Clinical Impressions(s) / UC Diagnoses   Final diagnoses:  Diarrhea, unspecified type  Suspected Covid-19 Virus Infection  Elevated blood pressure reading     Discharge Instructions      You may take 500mg  acetaminophen every 4-6 hours or in combination with ibuprofen 400-600mg  every 6-8 hours as needed for pain, inflammation, and fever.  Be sure to well hydrated with clear liquids and get at least 8 hours of sleep at night, preferably more while sick.   Be sure to get a lot of rest and stay well hydrated with sports drinks, water, diluted juices, and clear sodas.  Avoid fried fatty food, spicy food, and milk as these foods can cause worsening stomach upset.   Please follow up in 3-4 days if diarrhea not improving.  Call 911 or go to the hospital if symptoms worsening- passing out, severe headache, chest pain, shortness of breath, or other new concerning symptoms develop.      ED Prescriptions    Medication Sig Dispense Auth. Provider   ondansetron (ZOFRAN ODT) 4 MG disintegrating tablet Take 1 tablet (4 mg total) by mouth every 8 (eight) hours as needed. 12 tablet [name], Emonie Espericueta O, PA-C     Controlled Substance Prescriptions [place] Controlled Substance Registry consulted? Not Applicable   Rolla Platehelps, Kendrik Mcshan O, PA-C 08/12/18 1529

## 2018-08-12 NOTE — Telephone Encounter (Signed)
Called pt to make sure she made it to the hospital okay. Had to leave a VM. Also reminded her of the importance of going to the hospital for further evaluation of her BP if she had not already went.

## 2018-08-12 NOTE — Discharge Instructions (Signed)
°  You may take 500mg  acetaminophen every 4-6 hours or in combination with ibuprofen 400-600mg  every 6-8 hours as needed for pain, inflammation, and fever.  Be sure to well hydrated with clear liquids and get at least 8 hours of sleep at night, preferably more while sick.   Be sure to get a lot of rest and stay well hydrated with sports drinks, water, diluted juices, and clear sodas.  Avoid fried fatty food, spicy food, and milk as these foods can cause worsening stomach upset.   Please follow up in 3-4 days if diarrhea not improving.  Call 911 or go to the hospital if symptoms worsening- passing out, severe headache, chest pain, shortness of breath, or other new concerning symptoms develop.

## 2018-08-12 NOTE — ED Notes (Signed)
Per Princella Pellegrini advised the patient to proceed to the ED for further treatment of her elevated BP. Pt verbalized understanding and agreed. Advised her to keep her mask on and to notify the hospital admin as soon as she went in that she had been tested for COVID and was symptomatic.

## 2018-08-13 ENCOUNTER — Telehealth: Payer: Self-pay

## 2018-08-13 NOTE — Telephone Encounter (Signed)
Pt went to ER and did not stay due to having to pay a co-pay.  Went home and took BP medication.  Went over with patient the importance of getting a PCP to monitor BP and signs to go to ER.

## 2018-08-14 ENCOUNTER — Telehealth: Payer: Self-pay | Admitting: Emergency Medicine

## 2018-08-14 LAB — NOVEL CORONAVIRUS, NAA: SARS-CoV-2, NAA: DETECTED — AB

## 2018-08-14 NOTE — Telephone Encounter (Signed)
Called and spoke with patient today.  Patient is still having diarrhea, loss of taste and fever.  Patient is aware of positive COVID results.  Patient is aware of being quarantined for 14 days from on-set of symptoms. Patient is aware if symptoms worsen to go to to the ED for further evaluation.  Santiago Glad did work note and I faxed it to [employer] at [phone].

## 2019-03-19 DIAGNOSIS — R7303 Prediabetes: Secondary | ICD-10-CM | POA: Insufficient documentation

## 2019-03-19 DIAGNOSIS — I10 Essential (primary) hypertension: Secondary | ICD-10-CM | POA: Insufficient documentation

## 2019-06-24 DIAGNOSIS — I519 Heart disease, unspecified: Secondary | ICD-10-CM | POA: Insufficient documentation

## 2019-07-27 DIAGNOSIS — E782 Mixed hyperlipidemia: Secondary | ICD-10-CM | POA: Insufficient documentation

## 2019-09-16 ENCOUNTER — Encounter (INDEPENDENT_AMBULATORY_CARE_PROVIDER_SITE_OTHER): Payer: Self-pay | Admitting: Bariatrics

## 2019-09-17 ENCOUNTER — Encounter (INDEPENDENT_AMBULATORY_CARE_PROVIDER_SITE_OTHER): Payer: Self-pay | Admitting: Bariatrics

## 2019-09-17 ENCOUNTER — Ambulatory Visit (INDEPENDENT_AMBULATORY_CARE_PROVIDER_SITE_OTHER): Payer: Managed Care, Other (non HMO) | Admitting: Bariatrics

## 2019-09-17 ENCOUNTER — Other Ambulatory Visit: Payer: Self-pay

## 2019-09-17 VITALS — BP 148/86 | HR 94 | Temp 98.1°F | Ht 66.0 in | Wt 311.0 lb

## 2019-09-17 DIAGNOSIS — R0602 Shortness of breath: Secondary | ICD-10-CM

## 2019-09-17 DIAGNOSIS — Z0289 Encounter for other administrative examinations: Secondary | ICD-10-CM

## 2019-09-17 DIAGNOSIS — Z1331 Encounter for screening for depression: Secondary | ICD-10-CM | POA: Diagnosis not present

## 2019-09-17 DIAGNOSIS — Z6841 Body Mass Index (BMI) 40.0 and over, adult: Secondary | ICD-10-CM

## 2019-09-17 DIAGNOSIS — Z9189 Other specified personal risk factors, not elsewhere classified: Secondary | ICD-10-CM

## 2019-09-17 DIAGNOSIS — R5383 Other fatigue: Secondary | ICD-10-CM

## 2019-09-17 DIAGNOSIS — I10 Essential (primary) hypertension: Secondary | ICD-10-CM

## 2019-09-17 DIAGNOSIS — E559 Vitamin D deficiency, unspecified: Secondary | ICD-10-CM

## 2019-09-17 DIAGNOSIS — R7303 Prediabetes: Secondary | ICD-10-CM | POA: Diagnosis not present

## 2019-09-17 DIAGNOSIS — E782 Mixed hyperlipidemia: Secondary | ICD-10-CM

## 2019-09-18 ENCOUNTER — Encounter (INDEPENDENT_AMBULATORY_CARE_PROVIDER_SITE_OTHER): Payer: Self-pay | Admitting: Bariatrics

## 2019-09-18 DIAGNOSIS — E559 Vitamin D deficiency, unspecified: Secondary | ICD-10-CM | POA: Insufficient documentation

## 2019-09-18 LAB — COMPREHENSIVE METABOLIC PANEL
ALT: 15 IU/L (ref 0–32)
AST: 21 IU/L (ref 0–40)
Albumin/Globulin Ratio: 1.1 — ABNORMAL LOW (ref 1.2–2.2)
Albumin: 4 g/dL (ref 3.8–4.8)
Alkaline Phosphatase: 60 IU/L (ref 48–121)
BUN/Creatinine Ratio: 15 (ref 9–23)
BUN: 21 mg/dL (ref 6–24)
Bilirubin Total: 0.5 mg/dL (ref 0.0–1.2)
CO2: 25 mmol/L (ref 20–29)
Calcium: 9.7 mg/dL (ref 8.7–10.2)
Chloride: 101 mmol/L (ref 96–106)
Creatinine, Ser: 1.42 mg/dL — ABNORMAL HIGH (ref 0.57–1.00)
GFR calc Af Amer: 53 mL/min/{1.73_m2} — ABNORMAL LOW (ref >59)
GFR calc non Af Amer: 46 mL/min/{1.73_m2} — ABNORMAL LOW (ref >59)
Globulin, Total: 3.5 g/dL (ref 1.5–4.5)
Glucose: 90 mg/dL (ref 65–99)
Potassium: 3.5 mmol/L (ref 3.5–5.2)
Sodium: 139 mmol/L (ref 134–144)
Total Protein: 7.5 g/dL (ref 6.0–8.5)

## 2019-09-18 LAB — LIPID PANEL WITH LDL/HDL RATIO
Cholesterol, Total: 194 mg/dL (ref 100–199)
HDL: 38 mg/dL — ABNORMAL LOW (ref >39)
LDL Chol Calc (NIH): 135 mg/dL — ABNORMAL HIGH (ref 0–99)
LDL/HDL Ratio: 3.6 ratio — ABNORMAL HIGH (ref 0.0–3.2)
Triglycerides: 115 mg/dL (ref 0–149)
VLDL Cholesterol Cal: 21 mg/dL (ref 5–40)

## 2019-09-18 LAB — HEMOGLOBIN A1C
Est. average glucose Bld gHb Est-mCnc: 126 mg/dL
Hgb A1c MFr Bld: 6 % — ABNORMAL HIGH (ref 4.8–5.6)

## 2019-09-18 LAB — VITAMIN D 25 HYDROXY (VIT D DEFICIENCY, FRACTURES): Vit D, 25-Hydroxy: 8.4 ng/mL — ABNORMAL LOW (ref 30.0–100.0)

## 2019-09-18 LAB — TSH: TSH: 1.94 u[IU]/mL (ref 0.450–4.500)

## 2019-09-18 LAB — INSULIN, RANDOM: INSULIN: 26 u[IU]/mL — ABNORMAL HIGH (ref 2.6–24.9)

## 2019-09-18 LAB — T3: T3, Total: 90 ng/dL (ref 71–180)

## 2019-09-18 LAB — T4, FREE: Free T4: 1.28 ng/dL (ref 0.82–1.77)

## 2019-09-22 NOTE — Progress Notes (Signed)
Chief Complaint:   OBESITY Beth Evans (MR# [id number]) is a 39 y.o. female who presents for evaluation and treatment of obesity and related comorbidities. Current BMI is Body mass index is 50.2 kg/m. Beth Evans has been struggling with her weight for many years and has been unsuccessful in either losing weight, maintaining weight loss, or reaching her healthy weight goal.  Beth Evans is currently in the action stage of change and ready to dedicate time achieving and maintaining a healthier weight. Beth Evans is interested in becoming our patient and working on intensive lifestyle modifications including (but not limited to) diet and exercise for weight loss.  Beth Evans says she likes to shop at times, but notes work and everyday life as obstructions.  She states that she usually skips breakfast.  Beth Evans's habits were reviewed today and are as follows: Her family eats meals together, she thinks her family will eat healthier with her, she has been heavy most of her life, her heaviest weight ever is her current weight, she skips breakfast frequently, she is frequently drinking liquids with calories, she frequently makes poor food choices, she frequently eats larger portions than normal and she struggles with emotional eating.  Depression Screen Beth Evans Food and Mood (modified PHQ-9) score was 9.  Depression screen PHQ 2/9 09/17/2019  Decreased Interest 1  Down, Depressed, Hopeless 1  PHQ - 2 Score 2  Altered sleeping 2  Tired, decreased energy 2  Change in appetite 1  Feeling bad or failure about yourself  1  Trouble concentrating 1  Moving slowly or fidgety/restless 0  Suicidal thoughts 0  PHQ-9 Score 9  Difficult doing work/chores Somewhat difficult   Subjective:   1. Other fatigue Beth Evans denies daytime somnolence and reports waking up still tired. Patent has a history of symptoms of morning fatigue, morning headache and snoring. Beth Evans generally gets 5 or 6 hours of sleep per  night, and states that she has poor quality sleep. Snoring is present. Apneic episodes are not present. Epworth Sleepiness Score is 4.  2. SOB (shortness of breath) on exertion Beth Evans notes increasing shortness of breath with exercising and seems to be worsening over time with weight gain. She notes getting out of breath sooner with activity than she used to. This has gotten worse recently. Beth Evans denies shortness of breath at rest or orthopnea.  3. Essential hypertension Review: taking medications as instructed, no medication side effects noted, no chest pain on exertion, no dyspnea on exertion, no swelling of ankles.  She is taking HCTZ, metoprolol.  Slightly elevated blood pressure today.  BP Readings from Last 3 Encounters:  08/12/18 (!) 177/146  02/10/16 (!) 176/122  09/06/15 (!) 193/130   4. Mixed hyperlipidemia Beth Evans has hyperlipidemia and has been trying to improve her cholesterol levels with intensive lifestyle modification including a low saturated fat diet, exercise and weight loss. She denies any chest pain, claudication or myalgias.  Taking atorvastatin.  Lab Results  Component Value Date   ALT 15 09/17/2019   AST 21 09/17/2019   ALKPHOS 60 09/17/2019   BILITOT 0.5 09/17/2019   Lab Results  Component Value Date   CHOL 194 09/17/2019   HDL 38 (L) 09/17/2019   LDLCALC 135 (H) 09/17/2019   TRIG 115 09/17/2019   5. Prediabetes Beth Evans has a diagnosis of prediabetes based on her elevated HgA1c and was informed this puts her at greater risk of developing diabetes. She continues to work on diet and exercise to decrease her risk of diabetes.  She denies nausea or hypoglycemia.  She is on no medications.  Lab Results  Component Value Date   HGBA1C 6.0 (H) 09/17/2019   Lab Results  Component Value Date   INSULIN 26.0 (H) 09/17/2019   6. Vitamin D deficiency She is currently taking no vitamin D supplement. She denies nausea, vomiting or muscle weakness.  7. Depression  screening Beth Evans was screened for depression as part of her new patient workup today.  8. At risk for diabetes mellitus Beth Evans is at higher than average risk for developing diabetes due to her obesity and prediabetes.   Assessment/Plan:   1. Other fatigue Beth Evans does feel that her weight is causing her energy to be lower than it should be. Fatigue may be related to obesity, depression or many other causes. Labs will be ordered, and in the meanwhile, Beth Evans will focus on self care including making healthy food choices, increasing physical activity and focusing on stress reduction.  - EKG 12-Lead - Comprehensive metabolic panel - Hemoglobin A1c - Insulin, random - Lipid Panel With LDL/HDL Ratio - T4, free - TSH - T3 - VITAMIN D 25 Hydroxy (Vit-D Deficiency, Fractures)  2. SOB (shortness of breath) on exertion Beth Evans does feel that she gets out of breath more easily that she used to when she exercises. Beth Evans's shortness of breath appears to be obesity related and exercise induced. She has agreed to work on weight loss and gradually increase exercise to treat her exercise induced shortness of breath. Will continue to monitor closely.  - Lipid Panel With LDL/HDL Ratio  3. Essential hypertension Beth Evans is working on healthy weight loss and exercise to improve blood pressure control. We will watch for signs of hypotension as she continues her lifestyle modifications.  Continue medications.  4. Mixed hyperlipidemia Cardiovascular risk and specific lipid/LDL goals reviewed.  We discussed several lifestyle modifications today and Beth Evans will continue to work on diet, exercise and weight loss efforts. Orders and follow up as documented in patient record.  Continue medications.  Will check CMP and lipid panel today.  Counseling Intensive lifestyle modifications are the first line treatment for this issue. . Dietary changes: Increase soluble fiber. Decrease simple  carbohydrates. . Exercise changes: Moderate to vigorous-intensity aerobic activity 150 minutes per week if tolerated. . Lipid-lowering medications: see documented in medical record. .  - Lipid Panel With LDL/HDL Ratio  5. Prediabetes Beth Evans will continue to work on weight loss, exercise, and decreasing simple carbohydrates to help decrease the risk of diabetes.  Will check A1c and insulin today.  - Hemoglobin A1c - Insulin, random  6. Vitamin D deficiency Low Vitamin D level contributes to fatigue and are associated with obesity, breast, and colon cancer.  Will check vitamin D level today.  - VITAMIN D 25 Hydroxy (Vit-D Deficiency, Fractures)  7. Depression screening Beth Evans had a positive depression screening. Depression is commonly associated with obesity and often results in emotional eating behaviors. We will monitor this closely and work on CBT to help improve the non-hunger eating patterns. Referral to Psychology may be required if no improvement is seen as she continues in our clinic.  8. At risk for diabetes mellitus Beth Evans was given approximately 15 minutes of diabetes education and counseling today. We discussed intensive lifestyle modifications today with an emphasis on weight loss as well as increasing exercise and decreasing simple carbohydrates in her diet. We also reviewed medication options with an emphasis on risk versus benefit of those discussed.   Repetitive spaced learning was  employed today to elicit superior memory formation and behavioral change.  9. Class 3 severe obesity with serious comorbidity and body mass index (BMI) of 50.0 to 59.9 in adult, unspecified obesity type (HCC) Beth Evans is currently in the action stage of change and her goal is to continue with weight loss efforts. I recommend Beth Evans begin the structured treatment plan as follows:  She has agreed to the Category 3 Plan.  She will work on meal planning, mindful eating, and will stop all sugary  drinks.  Exercise goals: No exercise has been prescribed at this time.   Behavioral modification strategies: increasing lean protein intake, decreasing simple carbohydrates, increasing vegetables, increasing water intake, decreasing eating out, no skipping meals, meal planning and cooking strategies, keeping healthy foods in the home and planning for success.  She was informed of the importance of frequent follow-up visits to maximize her success with intensive lifestyle modifications for her multiple health conditions. She was informed we would discuss her lab results at her next visit unless there is a critical issue that needs to be addressed sooner. Beth Evans agreed to keep her next visit at the agreed upon time to discuss these results.  Objective:   Pulse 94, temperature 98.1 F (36.7 C), height 5\' 6"  (1.676 m), weight (!) 311 lb (141.1 kg), last menstrual period 08/18/2019, SpO2 96 %. Body mass index is 50.2 kg/m.  EKG: Normal sinus rhythm, rate 90 bpm.  Indirect Calorimeter completed today shows a VO2 of 337 and a REE of 2345.  Her calculated basal metabolic rate is 10/18/2019 thus her basal metabolic rate is better than expected.  General: Cooperative, alert, well developed, in no acute distress. HEENT: Conjunctivae and lids unremarkable. Cardiovascular: Regular rhythm.  Lungs: Normal work of breathing. Neurologic: No focal deficits.   Lab Results  Component Value Date   CREATININE 1.42 (H) 09/17/2019   BUN 21 09/17/2019   NA 139 09/17/2019   K 3.5 09/17/2019   CL 101 09/17/2019   CO2 25 09/17/2019   Lab Results  Component Value Date   ALT 15 09/17/2019   AST 21 09/17/2019   ALKPHOS 60 09/17/2019   BILITOT 0.5 09/17/2019   Lab Results  Component Value Date   HGBA1C 6.0 (H) 09/17/2019   Lab Results  Component Value Date   INSULIN 26.0 (H) 09/17/2019   Lab Results  Component Value Date   TSH 1.940 09/17/2019   Lab Results  Component Value Date   CHOL 194  09/17/2019   HDL 38 (L) 09/17/2019   LDLCALC 135 (H) 09/17/2019   TRIG 115 09/17/2019   Attestation Statements:   Reviewed by clinician on day of visit: allergies, medications, problem list, medical history, surgical history, family history, social history, and previous encounter notes.  I, 11/17/2019, CMA, am acting as Insurance claims handler for Energy manager, DO  I have reviewed the above documentation for accuracy and completeness, and I agree with the above. [employer], DO

## 2019-09-23 ENCOUNTER — Encounter (INDEPENDENT_AMBULATORY_CARE_PROVIDER_SITE_OTHER): Payer: Self-pay | Admitting: Bariatrics

## 2019-10-01 ENCOUNTER — Encounter (INDEPENDENT_AMBULATORY_CARE_PROVIDER_SITE_OTHER): Payer: Self-pay | Admitting: Bariatrics

## 2019-10-01 ENCOUNTER — Encounter (INDEPENDENT_AMBULATORY_CARE_PROVIDER_SITE_OTHER): Payer: Managed Care, Other (non HMO) | Admitting: Bariatrics

## 2019-10-01 ENCOUNTER — Other Ambulatory Visit: Payer: Self-pay

## 2019-10-01 VITALS — BP 200/152 | HR 87 | Temp 98.0°F | Ht 66.0 in | Wt 316.0 lb

## 2019-10-01 DIAGNOSIS — I10 Essential (primary) hypertension: Secondary | ICD-10-CM

## 2019-10-01 NOTE — Progress Notes (Addendum)
Pt B/P during rooming process was 187/150. Recheck was 200/152. Pt states she has taken her medication. PT denies H/A, nausea, visual disturbances/dizziness, chest pain, and SOB. Dr.A. Manson Passey notified and providers request pt was sent to ER for evaluation.  PT stated she would go to ER in [place]. ER notified per provider request that pt is in route. PT left in stable condition 0 distress noted while in office.  Nadeen Shipman LPN

## 2019-10-07 NOTE — Progress Notes (Signed)
This encounter was created in error - please disregard.

## 2019-10-29 ENCOUNTER — Ambulatory Visit (INDEPENDENT_AMBULATORY_CARE_PROVIDER_SITE_OTHER): Payer: Managed Care, Other (non HMO) | Admitting: Bariatrics

## 2020-04-09 DEATH — deceased
